# Patient Record
Sex: Female | Born: 1985 | Race: White | Hispanic: No | Marital: Single | Smoking: Never smoker
Health system: Southern US, Community
[De-identification: ages and names within clinical notes are randomized; demographics above are authoritative.]

---

## 2014-04-11 HISTORY — PX: CHOLECYSTECTOMY: SHX55

## 2015-12-01 ENCOUNTER — Ambulatory Visit (INDEPENDENT_AMBULATORY_CARE_PROVIDER_SITE_OTHER): Payer: BC Managed Care – PPO | Admitting: Internal Medicine

## 2015-12-01 ENCOUNTER — Encounter: Payer: Self-pay | Admitting: Internal Medicine

## 2015-12-01 VITALS — BP 124/86 | HR 87 | Temp 98.4°F | Resp 16 | Ht 66.0 in | Wt 175.0 lb

## 2015-12-01 DIAGNOSIS — Z23 Encounter for immunization: Secondary | ICD-10-CM

## 2015-12-01 DIAGNOSIS — L309 Dermatitis, unspecified: Secondary | ICD-10-CM | POA: Insufficient documentation

## 2015-12-01 DIAGNOSIS — Z0000 Encounter for general adult medical examination without abnormal findings: Secondary | ICD-10-CM

## 2015-12-01 NOTE — Progress Notes (Signed)
Subjective:    Patient ID: Theresa Bray, female    DOB: 1986-02-24, 30 y.o.   MRN: [id number]  HPI She is here to establish with a new pcp.  She is here for a physical exam.   She moved to the area about one year ago.  She has no concerns - just here to establish and have a physical exam.     Medications and allergies reviewed with patient and updated if appropriate.  Patient Active Problem List   Diagnosis Date Noted  . Eczema 12/01/2015    No current outpatient prescriptions on file prior to visit.   No current facility-administered medications on file prior to visit.     History reviewed. No pertinent past medical history.  Past Surgical History:  Procedure Laterality Date  . CHOLECYSTECTOMY  2016    Social History   Social History  . Marital status: Single    Spouse name: N/A  . Number of children: N/A  . Years of education: N/A   Social History Main Topics  . Smoking status: Never Smoker  . Smokeless tobacco: Never Used  . Alcohol use Yes     Comment: social  . Drug use: No  . Sexual activity: Not Asked   Other Topics Concern  . None   Social History Narrative   Exercise: regular    Family History  Problem Relation Age of Onset  . Hyperlipidemia Father   . Cancer Brother     Brain  . Cancer Maternal Grandfather     Prostate  . Cancer Paternal Grandfather     Lung    Review of Systems  Constitutional: Negative for appetite change, fatigue and fever.  Eyes: Negative for visual disturbance.  Respiratory: Negative for cough, shortness of breath and wheezing.   Cardiovascular: Negative for chest pain, palpitations and leg swelling.  Gastrointestinal: Negative for abdominal pain, blood in stool, constipation, diarrhea and nausea.  Genitourinary: Negative for dysuria and hematuria.  Musculoskeletal: Negative for arthralgias and back pain.  Skin: Negative for color change and rash.  Neurological: Negative for dizziness, light-headedness  and headaches.  Psychiatric/Behavioral: Negative for dysphoric mood. The patient is not nervous/anxious.        Objective:   Vitals:   12/01/15 0912  BP: 124/86  Pulse: 87  Resp: 16  Temp: 98.4 F (36.9 C)   Filed Weights   12/01/15 0912  Weight: 175 lb (79.4 kg)   Body mass index is 28.25 kg/m.   Physical Exam Constitutional: She appears well-developed and well-nourished. No distress.  HENT:  Head: Normocephalic and atraumatic.  Right Ear: External ear normal. Normal ear canal and TM Left Ear: External ear normal.  Normal ear canal and TM Mouth/Throat: Oropharynx is clear and moist.  Eyes: Conjunctivae and EOM are normal.  Neck: Neck supple. No tracheal deviation present. No thyromegaly present.  No carotid bruit  Cardiovascular: Normal rate, regular rhythm and normal heart sounds.   No murmur heard.  No edema. Pulmonary/Chest: Effort normal and breath sounds normal. No respiratory distress. She has no wheezes. She has no rales.  Breast: deferred to Gyn Abdominal: Soft. She exhibits no distension. There is no tenderness.  Lymphadenopathy: She has no cervical adenopathy.  Skin: Skin is warm and dry. She is not diaphoretic.  Psychiatric: She has a normal mood and affect. Her behavior is normal.         Assessment & Plan:   Physical exam: Screening blood work ordered Immunizations  tdap today  Gyn  - needs to establish - recommendations given Eye exams  Up to date  Exercise  - regular Weight - slightly overweight - started regular exercise again Skin   -no coners Substance abuse  -  none  See Problem List for Assessment and Plan of chronic medical problems.

## 2015-12-01 NOTE — Patient Instructions (Addendum)
[facility] - Dr Z[facility] [address] [place], [place] Across the street from [place], [place]   Test(s) ordered today. Your results will be released to [place] (or called to you) after review, usually within 72hours after test completion. If any changes need to be made, you will be notified at that same time.  All other Health Maintenance issues reviewed.   All recommended immunizations and age-appropriate screenings are up-to-date or discussed.  Tetanus vaccine administered today.   Medications reviewed and updated.  No changes recommended at this time.      Health Maintenance, Female Adopting a healthy lifestyle and getting preventive care can go a long way to promote health and wellness. Talk with your health care provider about what schedule of regular examinations is right for you. This is a good chance for you to check in with your provider about disease prevention and staying healthy. In between checkups, there are plenty of things you can do on your own. Experts have done a lot of research about which lifestyle changes and preventive measures are most likely to keep you healthy. Ask your health care provider for more information. WEIGHT AND DIET  Eat a healthy diet  Be sure to include plenty of vegetables, fruits, low-fat dairy products, and lean protein.  Do not eat a lot of foods high in solid fats, added sugars, or salt.  Get regular exercise. This is one of the most important things you can do for your health.  Most adults should exercise for at least 150 minutes each week. The exercise should increase your heart rate and make you sweat (moderate-intensity exercise).  Most adults should also do strengthening exercises at least twice a week. This is in addition to the  moderate-intensity exercise.  Maintain a healthy weight  Body mass index (BMI) is a measurement that can be used to identify possible weight problems. It estimates body fat based on height and weight. Your health care provider can help determine your BMI and help you achieve or maintain a healthy weight.  For females 110 years of age and older:   A BMI below 18.5 is considered underweight.  A BMI of 18.5 to 24.9 is normal.  A BMI of 25 to 29.9 is considered overweight.  A BMI of 30 and above is considered obese.  Watch levels of cholesterol and blood lipids  You should start having your blood tested for lipids and cholesterol at 30 years of age, then have this test every 5 years.  You may need to have your cholesterol levels checked more often if:  Your lipid or cholesterol levels are high.  You are older than 30 years of age.  You are at high risk for heart disease.  CANCER SCREENING   Lung Cancer  Lung cancer screening is recommended for adults 64-63 years old who are at high risk for lung cancer because of a history of smoking.  A yearly low-dose CT scan of the lungs is recommended for people who:  Currently smoke.  Have quit within the past 15 years.  Have at least a 30-pack-year history of smoking. A pack year is smoking an average of one pack of cigarettes a day for 1 year.  Yearly screening should continue until it has been 15 years since you quit.  Yearly screening should stop if you  develop a health problem that would prevent you from having lung cancer treatment.  Breast Cancer  Practice breast self-awareness. This means understanding how your breasts normally appear and feel.  It also means doing regular breast self-exams. Let your health care provider know about any changes, no matter how small.  If you are in your 20s or 30s, you should have a clinical breast exam (CBE) by a health care provider every 1-3 years as part of a regular health exam.  If  you are 67 or older, have a CBE every year. Also consider having a breast X-ray (mammogram) every year.  If you have a family history of breast cancer, talk to your health care provider about genetic screening.  If you are at high risk for breast cancer, talk to your health care provider about having an MRI and a mammogram every year.  Breast cancer gene (BRCA) assessment is recommended for women who have family members with BRCA-related cancers. BRCA-related cancers include:  Breast.  Ovarian.  Tubal.  Peritoneal cancers.  Results of the assessment will determine the need for genetic counseling and BRCA1 and BRCA2 testing. Cervical Cancer Your health care provider may recommend that you be screened regularly for cancer of the pelvic organs (ovaries, uterus, and vagina). This screening involves a pelvic examination, including checking for microscopic changes to the surface of your cervix (Pap test). You may be encouraged to have this screening done every 3 years, beginning at age 57.  For women ages 59-65, health care providers may recommend pelvic exams and Pap testing every 3 years, or they may recommend the Pap and pelvic exam, combined with testing for human papilloma virus (HPV), every 5 years. Some types of HPV increase your risk of cervical cancer. Testing for HPV may also be done on women of any age with unclear Pap test results.  Other health care providers may not recommend any screening for nonpregnant women who are considered low risk for pelvic cancer and who do not have symptoms. Ask your health care provider if a screening pelvic exam is right for you.  If you have had past treatment for cervical cancer or a condition that could lead to cancer, you need Pap tests and screening for cancer for at least 20 years after your treatment. If Pap tests have been discontinued, your risk factors (such as having a new sexual partner) need to be reassessed to determine if screening should  resume. Some women have medical problems that increase the chance of getting cervical cancer. In these cases, your health care provider may recommend more frequent screening and Pap tests. Colorectal Cancer  This type of cancer can be detected and often prevented.  Routine colorectal cancer screening usually begins at 30 years of age and continues through 30 years of age.  Your health care provider may recommend screening at an earlier age if you have risk factors for colon cancer.  Your health care provider may also recommend using home test kits to check for hidden blood in the stool.  A small camera at the end of a tube can be used to examine your colon directly (sigmoidoscopy or colonoscopy). This is done to check for the earliest forms of colorectal cancer.  Routine screening usually begins at age 62.  Direct examination of the colon should be repeated every 5-10 years through 29 years of age. However, you may need to be screened more often if early forms of precancerous polyps or small growths are found. Skin Cancer  Check your skin from head to toe regularly.  Tell your health care provider about any new moles or changes in moles, especially if there is a change in a mole's shape or color.  Also tell your health care provider if you have a mole that is larger than the size of a pencil eraser.  Always use sunscreen. Apply sunscreen liberally and repeatedly throughout the day.  Protect yourself by wearing long sleeves, pants, a wide-brimmed hat, and sunglasses whenever you are outside. HEART DISEASE, DIABETES, AND HIGH BLOOD PRESSURE   High blood pressure causes heart disease and increases the risk of stroke. High blood pressure is more likely to develop in:  People who have blood pressure in the high end of the normal range (130-139/85-89 mm Hg).  People who are overweight or obese.  People who are African American.  If you are 20-2 years of age, have your blood pressure  checked every 3-5 years. If you are 22 years of age or older, have your blood pressure checked every year. You should have your blood pressure measured twice--once when you are at a hospital or clinic, and once when you are not at a hospital or clinic. Record the average of the two measurements. To check your blood pressure when you are not at a hospital or clinic, you can use:  An automated blood pressure machine at a pharmacy.  A home blood pressure monitor.  If you are between 12 years and 65 years old, ask your health care provider if you should take aspirin to prevent strokes.  Have regular diabetes screenings. This involves taking a blood sample to check your fasting blood sugar level.  If you are at a normal weight and have a low risk for diabetes, have this test once every three years after 30 years of age.  If you are overweight and have a high risk for diabetes, consider being tested at a younger age or more often. PREVENTING INFECTION  Hepatitis B  If you have a higher risk for hepatitis B, you should be screened for this virus. You are considered at high risk for hepatitis B if:  You were born in a country where hepatitis B is common. Ask your health care provider which countries are considered high risk.  Your parents were born in a high-risk country, and you have not been immunized against hepatitis B (hepatitis B vaccine).  You have HIV or AIDS.  You use needles to inject street drugs.  You live with someone who has hepatitis B.  You have had sex with someone who has hepatitis B.  You get hemodialysis treatment.  You take certain medicines for conditions, including cancer, organ transplantation, and autoimmune conditions. Hepatitis C  Blood testing is recommended for:  Everyone born from [birth year] through 1965.  Anyone with known risk factors for hepatitis C. Sexually transmitted infections (STIs)  You should be screened for sexually transmitted infections (STIs)  including gonorrhea and chlamydia if:  You are sexually active and are younger than 30 years of age.  You are older than 30 years of age and your health care provider tells you that you are at risk for this type of infection.  Your sexual activity has changed since you were last screened and you are at an increased risk for chlamydia or gonorrhea. Ask your health care provider if you are at risk.  If you do not have HIV, but are at risk, it may be recommended that you take a prescription medicine  daily to prevent HIV infection. This is called pre-exposure prophylaxis (PrEP). You are considered at risk if:  You are sexually active and do not regularly use condoms or know the HIV status of your partner(s).  You take drugs by injection.  You are sexually active with a partner who has HIV. Talk with your health care provider about whether you are at high risk of being infected with HIV. If you choose to begin PrEP, you should first be tested for HIV. You should then be tested every 3 months for as long as you are taking PrEP.  PREGNANCY   If you are premenopausal and you may become pregnant, ask your health care provider about preconception counseling.  If you may become pregnant, take 400 to 800 micrograms (mcg) of folic acid every day.  If you want to prevent pregnancy, talk to your health care provider about birth control (contraception). OSTEOPOROSIS AND MENOPAUSE   Osteoporosis is a disease in which the bones lose minerals and strength with aging. This can result in serious bone fractures. Your risk for osteoporosis can be identified using a bone density scan.  If you are 65 years of age or older, or if you are at risk for osteoporosis and fractures, ask your health care provider if you should be screened.  Ask your health care provider whether you should take a calcium or vitamin D supplement to lower your risk for osteoporosis.  Menopause may have certain physical symptoms and  risks.  Hormone replacement therapy may reduce some of these symptoms and risks. Talk to your health care provider about whether hormone replacement therapy is right for you.  HOME CARE INSTRUCTIONS   Schedule regular health, dental, and eye exams.  Stay current with your immunizations.   Do not use any tobacco products including cigarettes, chewing tobacco, or electronic cigarettes.  If you are pregnant, do not drink alcohol.  If you are breastfeeding, limit how much and how often you drink alcohol.  Limit alcohol intake to no more than 1 drink per day for nonpregnant women. One drink equals 12 ounces of beer, 5 ounces of wine, or 1 ounces of hard liquor.  Do not use street drugs.  Do not share needles.  Ask your health care provider for help if you need support or information about quitting drugs.  Tell your health care provider if you often feel depressed.  Tell your health care provider if you have ever been abused or do not feel safe at home.   This information is not intended to replace advice given to you by your health care provider. Make sure you discuss any questions you have with your health care provider.   Document Released: 10/11/2010 Document Revised: 04/18/2014 Document Reviewed: 02/27/2013 Elsevier Interactive Patient Education [employer].

## 2015-12-01 NOTE — Assessment & Plan Note (Signed)
Controlled, no treatment needed

## 2015-12-01 NOTE — Progress Notes (Signed)
Pre visit review using our clinic review tool, if applicable. No additional management support is needed unless otherwise documented below in the visit note. 

## 2015-12-04 ENCOUNTER — Other Ambulatory Visit (INDEPENDENT_AMBULATORY_CARE_PROVIDER_SITE_OTHER): Payer: BC Managed Care – PPO

## 2015-12-04 DIAGNOSIS — Z0000 Encounter for general adult medical examination without abnormal findings: Secondary | ICD-10-CM | POA: Diagnosis not present

## 2015-12-04 LAB — COMPREHENSIVE METABOLIC PANEL
ALBUMIN: 3.6 g/dL (ref 3.5–5.2)
ALT: 12 U/L (ref 0–35)
AST: 17 U/L (ref 0–37)
Alkaline Phosphatase: 24 U/L — ABNORMAL LOW (ref 39–117)
BILIRUBIN TOTAL: 0.3 mg/dL (ref 0.2–1.2)
BUN: 15 mg/dL (ref 6–23)
CALCIUM: 8.3 mg/dL — AB (ref 8.4–10.5)
CHLORIDE: 109 meq/L (ref 96–112)
CO2: 25 mEq/L (ref 19–32)
CREATININE: 0.69 mg/dL (ref 0.40–1.20)
GFR: 106.06 mL/min (ref >60.00)
Glucose, Bld: 87 mg/dL (ref 70–99)
POTASSIUM: 4.2 meq/L (ref 3.5–5.1)
SODIUM: 140 meq/L (ref 135–145)
TOTAL PROTEIN: 6.5 g/dL (ref 6.0–8.3)

## 2015-12-04 LAB — LIPID PANEL
CHOLESTEROL: 168 mg/dL (ref 0–200)
HDL: 50.6 mg/dL (ref >39.00)
LDL CALC: 104 mg/dL — AB (ref 0–99)
NonHDL: 116.99
TRIGLYCERIDES: 67 mg/dL (ref 0.0–149.0)
Total CHOL/HDL Ratio: 3
VLDL: 13.4 mg/dL (ref 0.0–40.0)

## 2015-12-04 LAB — CBC WITH DIFFERENTIAL/PLATELET
BASOS PCT: 0.3 % (ref 0.0–3.0)
Basophils Absolute: 0 10*3/uL (ref 0.0–0.1)
EOS PCT: 2.1 % (ref 0.0–5.0)
Eosinophils Absolute: 0.1 10*3/uL (ref 0.0–0.7)
HEMATOCRIT: 38.4 % (ref 36.0–46.0)
HEMOGLOBIN: 13.1 g/dL (ref 12.0–15.0)
LYMPHS PCT: 29 % (ref 12.0–46.0)
Lymphs Abs: 1.2 10*3/uL (ref 0.7–4.0)
MCHC: 34.2 g/dL (ref 30.0–36.0)
MCV: 84.2 fl (ref 78.0–100.0)
MONOS PCT: 12.1 % — AB (ref 3.0–12.0)
Monocytes Absolute: 0.5 10*3/uL (ref 0.1–1.0)
Neutro Abs: 2.3 10*3/uL (ref 1.4–7.7)
Neutrophils Relative %: 56.5 % (ref 43.0–77.0)
Platelets: 199 10*3/uL (ref 150.0–400.0)
RBC: 4.57 Mil/uL (ref 3.87–5.11)
RDW: 14.1 % (ref 11.5–15.5)
WBC: 4.1 10*3/uL (ref 4.0–10.5)

## 2015-12-04 LAB — TSH: TSH: 1.44 u[IU]/mL (ref 0.35–4.50)

## 2018-09-10 ENCOUNTER — Telehealth: Payer: BC Managed Care – PPO | Admitting: Physician Assistant

## 2018-09-10 ENCOUNTER — Encounter: Payer: Self-pay | Admitting: Physician Assistant

## 2018-09-10 DIAGNOSIS — M545 Low back pain, unspecified: Secondary | ICD-10-CM

## 2018-09-10 MED ORDER — NAPROXEN 500 MG PO TABS: 500.0000 mg | ORAL_TABLET | Freq: Two times a day (BID) | ORAL | 0 refills | Status: DC

## 2018-09-10 MED ORDER — CYCLOBENZAPRINE HCL 10 MG PO TABS: 10.0000 mg | ORAL_TABLET | Freq: Three times a day (TID) | ORAL | 0 refills | Status: DC | PRN

## 2018-09-10 NOTE — Progress Notes (Signed)

## 2018-09-18 ENCOUNTER — Other Ambulatory Visit: Payer: Self-pay | Admitting: Physician Assistant

## 2018-10-26 ENCOUNTER — Encounter: Payer: Self-pay | Admitting: Internal Medicine

## 2018-11-05 NOTE — Progress Notes (Signed)
Subjective:    Patient ID: Theresa Bray, female    DOB: [birth year], 33 y.o.   MRN: [id number]  HPI She is here for a physical exam.   She is currently working from home.  Overall she is doing well.  She does have chronic lower back pain.  She has had this for years and initially was intermittent, but it has been more consistent recently.  She wakes up with the pain and notices it with certain positions or activities.  She recently did need visit and was prescribed Naprosyn and Flexeril.  The Naprosyn seemed to help, but the muscle relaxer did not.  She has never had her back evaluated.  She is exercising-walks.  She denies any back pain with walking.  Medications and allergies reviewed with patient and updated if appropriate.  Patient Active Problem List   Diagnosis Date Noted  . Family history of thyroid disease in mother 11/06/2018  . Lower back pain 11/06/2018  . Eczema 12/01/2015    No current outpatient medications on file prior to visit.   No current facility-administered medications on file prior to visit.     History reviewed. No pertinent past medical history.  Past Surgical History:  Procedure Laterality Date  . CHOLECYSTECTOMY  2016    Social History   Socioeconomic History  . Marital status: Single    Spouse name: Not on file  . Number of children: Not on file  . Years of education: Not on file  . Highest education level: Not on file  Occupational History  . Not on file  Social Needs  . Financial resource strain: Not on file  . Food insecurity    Worry: Not on file    Inability: Not on file  . Transportation needs    Medical: Not on file    Non-medical: Not on file  Tobacco Use  . Smoking status: Never Smoker  . Smokeless tobacco: Never Used  Substance and Sexual Activity  . Alcohol use: Yes    Comment: social  . Drug use: No  . Sexual activity: Not on file  Lifestyle  . Physical activity    Days per week: Not on file    Minutes per  session: Not on file  . Stress: Not on file  Relationships  . Social Herbalist on phone: Not on file    Gets together: Not on file    Attends religious service: Not on file    Active member of club or organization: Not on file    Attends meetings of clubs or organizations: Not on file    Relationship status: Not on file  Other Topics Concern  . Not on file  Social History Narrative   Exercise: regular    Family History  Problem Relation Age of Onset  . Hyperlipidemia Father   . Cancer Brother        Brain  . Cancer Maternal Grandfather        Prostate  . Cancer Paternal Grandfather        Lung  . Graves' disease Mother   . Cancer Maternal Aunt        bile duct cancer  . Cancer Maternal Uncle        lymphoma    Review of Systems  Constitutional: Negative for chills and fever.  Eyes: Negative for visual disturbance.  Respiratory: Negative for cough, shortness of breath and wheezing.   Cardiovascular: Negative for chest pain, palpitations and leg  swelling.  Gastrointestinal: Negative for abdominal pain, blood in stool, constipation, diarrhea and nausea.       No gerd  Genitourinary: Negative for dysuria and hematuria.  Musculoskeletal: Positive for back pain.  Skin: Negative for color change and rash.  Neurological: Negative for light-headedness and headaches.  Psychiatric/Behavioral: Negative for dysphoric mood. The patient is not nervous/anxious.        Objective:   Vitals:   11/06/18 1455  BP: 94/62  Pulse: 87  Resp: 14  Temp: 98.3 F (36.8 C)  SpO2: 97%   Filed Weights   11/06/18 1455  Weight: 217 lb (98.4 kg)   Body mass index is 35.02 kg/m.  BP Readings from Last 3 Encounters:  11/06/18 94/62  12/01/15 124/86    Wt Readings from Last 3 Encounters:  11/06/18 217 lb (98.4 kg)  12/01/15 175 lb (79.4 kg)     Physical Exam Constitutional: She appears well-developed and well-nourished. No distress.  HENT:  Head: Normocephalic and  atraumatic.  Right Ear: External ear normal. Normal ear canal and TM Left Ear: External ear normal.  Normal ear canal and TM Mouth/Throat: Oropharynx is clear and moist.  Eyes: Conjunctivae and EOM are normal.  Neck: Neck supple. No tracheal deviation present. No thyromegaly present.  No carotid bruit  Cardiovascular: Normal rate, regular rhythm and normal heart sounds.   No murmur heard.  No edema. Pulmonary/Chest: Effort normal and breath sounds normal. No respiratory distress. She has no wheezes. She has no rales.  Breast: deferred   Abdominal: Soft. She exhibits no distension. There is no tenderness.  Lymphadenopathy: She has no cervical adenopathy.  Skin: Skin is warm and dry. She is not diaphoretic.  Psychiatric: She has a normal mood and affect. Her behavior is normal.        Assessment & Plan:   Physical exam: Screening blood work ordered Immunizations   Up to date  Gyn  Up to date  Exercise    Walking dog daily - walks 3-4 miles most days Weight   Advised weight loss-advised decreasing portions, revising diet Skin no concerns Substance abuse  none  See Problem List for Assessment and Plan of chronic medical problems.

## 2018-11-05 NOTE — Patient Instructions (Addendum)
Tests ordered today. Your results will be released to Laird (or called to you) after review.  If any changes need to be made, you will be notified at that same time.  All other Health Maintenance issues reviewed.   All recommended immunizations and age-appropriate screenings are up-to-date or discussed.  No immunization administered today.   Medications reviewed and updated.  Changes include :   none      Health Maintenance, Female Adopting a healthy lifestyle and getting preventive care are important in promoting health and wellness. Ask your health care provider about:  The right schedule for you to have regular tests and exams.  Things you can do on your own to prevent diseases and keep yourself healthy. What should I know about diet, weight, and exercise? Eat a healthy diet   Eat a diet that includes plenty of vegetables, fruits, low-fat dairy products, and lean protein.  Do not eat a lot of foods that are high in solid fats, added sugars, or sodium. Maintain a healthy weight Body mass index (BMI) is used to identify weight problems. It estimates body fat based on height and weight. Your health care provider can help determine your BMI and help you achieve or maintain a healthy weight. Get regular exercise Get regular exercise. This is one of the most important things you can do for your health. Most adults should:  Exercise for at least 150 minutes each week. The exercise should increase your heart rate and make you sweat (moderate-intensity exercise).  Do strengthening exercises at least twice a week. This is in addition to the moderate-intensity exercise.  Spend less time sitting. Even light physical activity can be beneficial. Watch cholesterol and blood lipids Have your blood tested for lipids and cholesterol at 33 years of age, then have this test every 5 years. Have your cholesterol levels checked more often if:  Your lipid or cholesterol levels are high.  You  are older than 33 years of age.  You are at high risk for heart disease. What should I know about cancer screening? Depending on your health history and family history, you may need to have cancer screening at various ages. This may include screening for:  Breast cancer.  Cervical cancer.  Colorectal cancer.  Skin cancer.  Lung cancer. What should I know about heart disease, diabetes, and high blood pressure? Blood pressure and heart disease  High blood pressure causes heart disease and increases the risk of stroke. This is more likely to develop in people who have high blood pressure readings, are of African descent, or are overweight.  Have your blood pressure checked: ? Every 3-5 years if you are 52-46 years of age. ? Every year if you are 74 years old or older. Diabetes Have regular diabetes screenings. This checks your fasting blood sugar level. Have the screening done:  Once every three years after age 82 if you are at a normal weight and have a low risk for diabetes.  More often and at a younger age if you are overweight or have a high risk for diabetes. What should I know about preventing infection? Hepatitis B If you have a higher risk for hepatitis B, you should be screened for this virus. Talk with your health care provider to find out if you are at risk for hepatitis B infection. Hepatitis C Testing is recommended for:  Everyone born from [birth year] through 1965.  Anyone with known risk factors for hepatitis C. Sexually transmitted infections (STIs)  Get  screened for STIs, including gonorrhea and chlamydia, if: ? You are sexually active and are younger than 33 years of age. ? You are older than 33 years of age and your health care provider tells you that you are at risk for this type of infection. ? Your sexual activity has changed since you were last screened, and you are at increased risk for chlamydia or gonorrhea. Ask your health care provider if you are at risk.   Ask your health care provider about whether you are at high risk for HIV. Your health care provider may recommend a prescription medicine to help prevent HIV infection. If you choose to take medicine to prevent HIV, you should first get tested for HIV. You should then be tested every 3 months for as long as you are taking the medicine. Pregnancy  If you are about to stop having your period (premenopausal) and you may become pregnant, seek counseling before you get pregnant.  Take 400 to 800 micrograms (mcg) of folic acid every day if you become pregnant.  Ask for birth control (contraception) if you want to prevent pregnancy. Osteoporosis and menopause Osteoporosis is a disease in which the bones lose minerals and strength with aging. This can result in bone fractures. If you are 33 years old or older, or if you are at risk for osteoporosis and fractures, ask your health care provider if you should:  Be screened for bone loss.  Take a calcium or vitamin D supplement to lower your risk of fractures.  Be given hormone replacement therapy (HRT) to treat symptoms of menopause. Follow these instructions at home: Lifestyle  Do not use any products that contain nicotine or tobacco, such as cigarettes, e-cigarettes, and chewing tobacco. If you need help quitting, ask your health care provider.  Do not use street drugs.  Do not share needles.  Ask your health care provider for help if you need support or information about quitting drugs. Alcohol use  Do not drink alcohol if: ? Your health care provider tells you not to drink. ? You are pregnant, may be pregnant, or are planning to become pregnant.  If you drink alcohol: ? Limit how much you use to 0-1 drink a day. ? Limit intake if you are breastfeeding.  Be aware of how much alcohol is in your drink. In the [place] one drink equals one 12 oz bottle of beer (355 mL), one 5 oz glass of wine (148 mL), or one 1 oz glass of hard liquor (44  mL). General instructions  Schedule regular health, dental, and eye exams.  Stay current with your vaccines.  Tell your health care provider if: ? You often feel depressed. ? You have ever been abused or do not feel safe at home. Summary  Adopting a healthy lifestyle and getting preventive care are important in promoting health and wellness.  Follow your health care provider's instructions about healthy diet, exercising, and getting tested or screened for diseases.  Follow your health care provider's instructions on monitoring your cholesterol and blood pressure. This information is not intended to replace advice given to you by your health care provider. Make sure you discuss any questions you have with your health care provider. Document Released: 10/11/2010 Document Revised: 03/21/2018 Document Reviewed: 03/21/2018 Elsevier Patient Education  2020 [employer]

## 2018-11-06 ENCOUNTER — Other Ambulatory Visit (INDEPENDENT_AMBULATORY_CARE_PROVIDER_SITE_OTHER): Payer: BC Managed Care – PPO

## 2018-11-06 ENCOUNTER — Other Ambulatory Visit: Payer: Self-pay

## 2018-11-06 ENCOUNTER — Encounter: Payer: Self-pay | Admitting: Internal Medicine

## 2018-11-06 ENCOUNTER — Ambulatory Visit (INDEPENDENT_AMBULATORY_CARE_PROVIDER_SITE_OTHER): Payer: BC Managed Care – PPO | Admitting: Internal Medicine

## 2018-11-06 VITALS — BP 94/62 | HR 87 | Temp 98.3°F | Resp 14 | Ht 66.0 in | Wt 217.0 lb

## 2018-11-06 DIAGNOSIS — G8929 Other chronic pain: Secondary | ICD-10-CM | POA: Diagnosis not present

## 2018-11-06 DIAGNOSIS — M545 Low back pain, unspecified: Secondary | ICD-10-CM | POA: Insufficient documentation

## 2018-11-06 DIAGNOSIS — Z8349 Family history of other endocrine, nutritional and metabolic diseases: Secondary | ICD-10-CM

## 2018-11-06 DIAGNOSIS — Z0000 Encounter for general adult medical examination without abnormal findings: Secondary | ICD-10-CM

## 2018-11-06 LAB — CBC WITH DIFFERENTIAL/PLATELET
Basophils Absolute: 0 10*3/uL (ref 0.0–0.1)
Basophils Relative: 0.5 % (ref 0.0–3.0)
Eosinophils Absolute: 0.1 10*3/uL (ref 0.0–0.7)
Eosinophils Relative: 1.4 % (ref 0.0–5.0)
HCT: 43 % (ref 36.0–46.0)
Hemoglobin: 14.5 g/dL (ref 12.0–15.0)
Lymphocytes Relative: 31.6 % (ref 12.0–46.0)
Lymphs Abs: 2.1 10*3/uL (ref 0.7–4.0)
MCHC: 33.6 g/dL (ref 30.0–36.0)
MCV: 85.2 fl (ref 78.0–100.0)
Monocytes Absolute: 0.6 10*3/uL (ref 0.1–1.0)
Monocytes Relative: 8.8 % (ref 3.0–12.0)
Neutro Abs: 3.9 10*3/uL (ref 1.4–7.7)
Neutrophils Relative %: 57.7 % (ref 43.0–77.0)
Platelets: 275 10*3/uL (ref 150.0–400.0)
RBC: 5.05 Mil/uL (ref 3.87–5.11)
RDW: 12.6 % (ref 11.5–15.5)
WBC: 6.7 10*3/uL (ref 4.0–10.5)

## 2018-11-06 LAB — COMPREHENSIVE METABOLIC PANEL
ALT: 22 U/L (ref 0–35)
AST: 20 U/L (ref 0–37)
Albumin: 4.3 g/dL (ref 3.5–5.2)
Alkaline Phosphatase: 48 U/L (ref 39–117)
BUN: 10 mg/dL (ref 6–23)
CO2: 25 mEq/L (ref 19–32)
Calcium: 9.4 mg/dL (ref 8.4–10.5)
Chloride: 105 mEq/L (ref 96–112)
Creatinine, Ser: 0.61 mg/dL (ref 0.40–1.20)
GFR: 112.89 mL/min (ref >60.00)
Glucose, Bld: 86 mg/dL (ref 70–99)
Potassium: 3.9 mEq/L (ref 3.5–5.1)
Sodium: 138 mEq/L (ref 135–145)
Total Bilirubin: 0.3 mg/dL (ref 0.2–1.2)
Total Protein: 7.4 g/dL (ref 6.0–8.3)

## 2018-11-06 LAB — LIPID PANEL
Cholesterol: 182 mg/dL (ref 0–200)
HDL: 43.7 mg/dL (ref >39.00)
LDL Cholesterol: 113 mg/dL — ABNORMAL HIGH (ref 0–99)
NonHDL: 138.39
Total CHOL/HDL Ratio: 4
Triglycerides: 127 mg/dL (ref 0.0–149.0)
VLDL: 25.4 mg/dL (ref 0.0–40.0)

## 2018-11-06 LAB — TSH: TSH: 2.16 u[IU]/mL (ref 0.35–4.50)

## 2018-11-06 NOTE — Assessment & Plan Note (Signed)
Mom recently diagnosed with Graves' disease Check TSH

## 2018-11-06 NOTE — Assessment & Plan Note (Addendum)
Has had lower back pain for years-initially intermittent, but now having daily back pain Naprosyn helps-okay to take as needed for now, but not continuously Continue regular walking Advised weight loss Refer to sports medication for further evaluation

## 2018-11-07 ENCOUNTER — Encounter: Payer: Self-pay | Admitting: Internal Medicine

## 2018-12-07 ENCOUNTER — Encounter: Payer: Self-pay | Admitting: Family Medicine

## 2018-12-07 ENCOUNTER — Other Ambulatory Visit: Payer: Self-pay

## 2018-12-07 ENCOUNTER — Ambulatory Visit (INDEPENDENT_AMBULATORY_CARE_PROVIDER_SITE_OTHER): Payer: BC Managed Care – PPO | Admitting: Family Medicine

## 2018-12-07 VITALS — BP 100/60 | HR 85 | Ht 66.0 in | Wt 216.0 lb

## 2018-12-07 DIAGNOSIS — M545 Low back pain, unspecified: Secondary | ICD-10-CM

## 2018-12-07 DIAGNOSIS — M999 Biomechanical lesion, unspecified: Secondary | ICD-10-CM | POA: Diagnosis not present

## 2018-12-07 NOTE — Patient Instructions (Signed)
Good to see you.  Ice 20 minutes 2 times daily. Usually after activity and before bed. Exercises 3 times a week.  Turmeric 500mg  daily  Vitamin D 2000 IU daily  See me again in 6-8 weeks

## 2018-12-07 NOTE — Assessment & Plan Note (Signed)
Low back pain seems to be more of a sacroiliac dysfunction.  Discussed icing regimen and home exercise, which activities of doing which wants to avoid.  Patient is to increase activity slowly over the course the next several weeks.  Patient given exercises from athletic trainer.  We discussed changing position when working or sitting long amount of time.  Follow-up again in 4 to 8 weeks

## 2018-12-07 NOTE — Assessment & Plan Note (Signed)
Decision today to treat with OMT was based on Physical Exam  After verbal consent patient was treated with HVLA, ME, FPR techniques in cervical, thoracic, rib,  lumbar and sacral areas  Patient tolerated the procedure well with improvement in symptoms  Patient given exercises, stretches and lifestyle modifications  See medications in patient instructions if given  Patient will follow up in 4-8 weeks 

## 2018-12-07 NOTE — Progress Notes (Signed)
Tawana ScaleZach Smith D.O. [facility] [address] [place], [place] [address] Phone: [phone] Subjective:   I Theresa NighKana Thompson am serving as a Neurosurgeonscribe for Dr. Antoine PrimasZachary Smith.  I'm seeing this patient by the request  of:  Pincus SanesBurns, Stacy J, MD   CC: Back pain  BJY:[id number]:Subjective  Theresa FlavinMeredith Bray is a 33 y.o. female coming in with complaint of back pain. No numbness and tingling noted. Pain radiates up the back sometimes.   Onset- Chronic  Location - lower back Character- sharp  Aggravating factors- sitting to standing  Reliving factors-  Therapies tried- naproxen, muscle relaxer  Severity-6 out of 10     No past medical history on file. Past Surgical History:  Procedure Laterality Date  . CHOLECYSTECTOMY  2016   Social History   Socioeconomic History  . Marital status: Single    Spouse name: Not on file  . Number of children: Not on file  . Years of education: Not on file  . Highest education level: Not on file  Occupational History  . Not on file  Social Needs  . Financial resource strain: Not on file  . Food insecurity    Worry: Not on file    Inability: Not on file  . Transportation needs    Medical: Not on file    Non-medical: Not on file  Tobacco Use  . Smoking status: Never Smoker  . Smokeless tobacco: Never Used  Substance and Sexual Activity  . Alcohol use: Yes    Comment: social  . Drug use: No  . Sexual activity: Not on file  Lifestyle  . Physical activity    Days per week: Not on file    Minutes per session: Not on file  . Stress: Not on file  Relationships  . Social Musicianconnections    Talks on phone: Not on file    Gets together: Not on file    Attends religious service: Not on file    Active member of club or organization: Not on file    Attends meetings of clubs or organizations: Not on file    Relationship status: Not on file  Other Topics Concern  . Not on file  Social History Narrative   Exercise: regular   Not on File Family History   Problem Relation Age of Onset  . Hyperlipidemia Father   . Cancer Brother        Brain  . Cancer Maternal Grandfather        Prostate  . Cancer Paternal Grandfather        Lung  . Graves' disease Mother   . Cancer Maternal Aunt        bile duct cancer  . Cancer Maternal Uncle        lymphoma   No current outpatient medications on file.    Past medical history, social, surgical and family history all reviewed in electronic medical record.  No pertanent information unless stated regarding to the chief complaint.   Review of Systems:  No headache, visual changes, nausea, vomiting, diarrhea, constipation, dizziness, abdominal pain, skin rash, fevers, chills, night sweats, weight loss, swollen lymph nodes, body aches, joint swelling, muscle aches, chest pain, shortness of breath, mood changes.   Objective  Blood pressure 100/60, pulse 85, height 5\' 6"  (1.676 m), weight 216 lb (98 kg), SpO2 98 %.    General: No apparent distress alert and oriented x3 mood and affect normal, dressed appropriately.  Patient is overweight HEENT: Pupils equal, extraocular  movements intact  Respiratory: Patient's speak in full sentences and does not appear short of breath  Cardiovascular: No lower extremity edema, non tender, no erythema  Skin: Warm dry intact with no signs of infection or rash on extremities or on axial skeleton.  Abdomen: Soft nontender  Neuro: Cranial nerves II through XII are intact, neurovascularly intact in all extremities with 2+ DTRs and 2+ pulses.  Lymph: No lymphadenopathy of posterior or anterior cervical chain or axillae bilaterally.  Gait normal with good balance and coordination.  MSK:  Non tender with full range of motion and good stability and symmetric strength and tone of shoulders, elbows, wrist, hip, knee and ankles bilaterally.  Back exam shows loss of lordosis of the lumbar spine.  Minimal tenderness over the sacroiliac joint bilaterally.  Tightness of the hamstrings  with sitting but patient has even more tightness with standing.  Negative Trendelenburg.  5-5 strength of the lower extremities.  Osteopathic findings C4 flexed rotated and side bent left C7 flexed rotated and side bent left T3 extended rotated and side bent right inhaled third rib T9 extended rotated and side bent left L2 flexed rotated and side bent right Sacrum right on right  [id number]; 15 additional minutes spent for Therapeutic exercises as stated in above notes.  This included exercises focusing on stretching, strengthening, with significant focus on eccentric aspects.   Long term goals include an improvement in range of motion, strength, endurance as well as avoiding reinjury. Patient's frequency would include in 1-2 times a Bray, 3-5 times a week for a duration of 6-12 weeks.  Low back exercises that included:  Pelvic tilt/bracing instruction to focus on control of the pelvic girdle and lower abdominal muscles  Glute strengthening exercises, focusing on proper firing of the glutes without engaging the low back muscles Proper stretching techniques for maximum relief for the hamstrings, hip flexors, low back and some rotation where tolerated Proper technique shown and discussed handout in great detail with ATC.  All questions were discussed and answered.     Impression and Recommendations:     This case required medical decision making of moderate complexity. The above documentation has been reviewed and is accurate and complete Lyndal Pulley, DO       Note: This dictation was prepared with Dragon dictation along with smaller phrase technology. Any transcriptional errors that result from this process are unintentional.

## 2018-12-11 ENCOUNTER — Other Ambulatory Visit: Payer: Self-pay

## 2018-12-11 ENCOUNTER — Ambulatory Visit (INDEPENDENT_AMBULATORY_CARE_PROVIDER_SITE_OTHER)
Admission: RE | Admit: 2018-12-11 | Discharge: 2018-12-11 | Disposition: A | Discharge status: Home / Self Care | Payer: BC Managed Care – PPO | Source: Ambulatory Visit | Attending: Family Medicine | Admitting: Family Medicine

## 2018-12-11 DIAGNOSIS — M545 Low back pain, unspecified: Secondary | ICD-10-CM

## 2019-01-29 ENCOUNTER — Other Ambulatory Visit: Payer: Self-pay

## 2019-01-29 DIAGNOSIS — Z20822 Contact with and (suspected) exposure to covid-19: Secondary | ICD-10-CM

## 2019-01-30 LAB — NOVEL CORONAVIRUS, NAA: SARS-CoV-2, NAA: NOT DETECTED

## 2019-01-31 ENCOUNTER — Other Ambulatory Visit: Payer: Self-pay

## 2019-01-31 ENCOUNTER — Ambulatory Visit: Payer: BC Managed Care – PPO | Admitting: Family Medicine

## 2019-01-31 ENCOUNTER — Encounter: Payer: Self-pay | Admitting: Family Medicine

## 2019-01-31 VITALS — BP 90/62 | HR 94 | Ht 66.0 in | Wt 215.8 lb

## 2019-01-31 DIAGNOSIS — M999 Biomechanical lesion, unspecified: Secondary | ICD-10-CM

## 2019-01-31 DIAGNOSIS — M545 Low back pain, unspecified: Secondary | ICD-10-CM

## 2019-01-31 DIAGNOSIS — G8929 Other chronic pain: Secondary | ICD-10-CM

## 2019-01-31 NOTE — Assessment & Plan Note (Signed)
Decision today to treat with OMT was based on Physical Exam  After verbal consent patient was treated with HVLA, ME, FPR techniques in cervical, thoracic, rib lumbar and sacral areas  Patient tolerated the procedure well with improvement in symptoms  Patient given exercises, stretches and lifestyle modifications  See medications in patient instructions if given  Patient will follow up in 4-8 weeks 

## 2019-01-31 NOTE — Progress Notes (Signed)
[facility] [address] [place], [place] [address] Phone: [phone] Subjective:    I'm seeing this patient by the request  of:    CC: Back pain follow-up  [id number]    I, Theresa Bray, LAT, ATC, am serving as scribe for Dr. Hulan Saas.  12/07/18: Low back pain seems to be more of a sacroiliac dysfunction.  Discussed icing regimen and home exercise, which activities of doing which wants to avoid.  Patient is to increase activity slowly over the course the next several weeks.  Patient given exercises from athletic trainer.  We discussed changing position when working or sitting long amount of time.  Follow-up again in 4 to 8 weeks  Update- 01/31/19 Theresa Bray is a 33 y.o. female coming in with complaint of back pain.  She states her low back pain is slightly better.  She states that she would like to go over her x-ray results.  She states that she's been doing her HEP on average 3x/week.      No past medical history on file. Past Surgical History:  Procedure Laterality Date  . CHOLECYSTECTOMY  2016   Social History   Socioeconomic History  . Marital status: Single    Spouse name: Not on file  . Number of children: Not on file  . Years of education: Not on file  . Highest education level: Not on file  Occupational History  . Not on file  Social Needs  . Financial resource strain: Not on file  . Food insecurity    Worry: Not on file    Inability: Not on file  . Transportation needs    Medical: Not on file    Non-medical: Not on file  Tobacco Use  . Smoking status: Never Smoker  . Smokeless tobacco: Never Used  Substance and Sexual Activity  . Alcohol use: Yes    Comment: social  . Drug use: No  . Sexual activity: Not on file  Lifestyle  . Physical activity    Days per week: Not on file    Minutes per session: Not on file  . Stress: Not on file  Relationships  . Social Herbalist on phone: Not on file   Gets together: Not on file    Attends religious service: Not on file    Active member of club or organization: Not on file    Attends meetings of clubs or organizations: Not on file    Relationship status: Not on file  Other Topics Concern  . Not on file  Social History Narrative   Exercise: regular   Not on File Family History  Problem Relation Age of Onset  . Hyperlipidemia Father   . Cancer Brother        Brain  . Cancer Maternal Grandfather        Prostate  . Cancer Paternal Grandfather        Lung  . Graves' disease Mother   . Cancer Maternal Aunt        bile duct cancer  . Cancer Maternal Uncle        lymphoma   No current outpatient medications on file.    Past medical history, social, surgical and family history all reviewed in electronic medical record.  No pertanent information unless stated regarding to the chief complaint.   Review of Systems:  No headache, visual changes, nausea, vomiting, diarrhea, constipation, dizziness, abdominal pain, skin rash, fevers, chills, night sweats,  weight loss, swollen lymph nodes, body aches, joint swelling, muscle aches, chest pain, shortness of breath, mood changes.   Objective  Blood pressure 90/62, pulse 94, height 5\' 6"  (1.676 m), weight 215 lb 12.8 oz (97.9 kg), SpO2 98 %.   General: No apparent distress alert and oriented x3 mood and affect normal, dressed appropriately.  HEENT: Pupils equal, extraocular movements intact  Respiratory: Patient's speak in full sentences and does not appear short of breath  Cardiovascular: No lower extremity edema, non tender, no erythema  Skin: Warm dry intact with no signs of infection or rash on extremities or on axial skeleton.  Abdomen: Soft nontender  Neuro: Cranial nerves II through XII are intact, neurovascularly intact in all extremities with 2+ DTRs and 2+ pulses.  Lymph: No lymphadenopathy of posterior or anterior cervical chain or axillae bilaterally.  Gait normal with good  balance and coordination.  MSK:  Non tender with full range of motion and good stability and symmetric strength and tone of shoulders, elbows, wrist, hip, knee and ankles bilaterally.  Back Exam:  Inspection: Unremarkable  Motion: Flexion 40 deg, Extension 25 deg, Side Bending to 35 deg bilaterally,  Rotation to 45 deg bilaterally  SLR laying: Negative  XSLR laying: Negative  Palpable tenderness: Tender to palpation in paraspinal musculature lumbar spine right greater than left. FABER: Mild tightness bilaterally. Sensory change: Gross sensation intact to all lumbar and sacral dermatomes.  Reflexes: 2+ at both patellar tendons, 2+ at achilles tendons, Babinski's downgoing.  Strength at foot  Plantar-flexion: 5/5 Dorsi-flexion: 5/5 Eversion: 5/5 Inversion: 5/5  Leg strength  Quad: 5/5 Hamstring: 5/5 Hip flexor: 5/5 Hip abductors: 5/5   Osteopathic findings  C6 flexed rotated and side bent left T3 extended rotated and side bent right inhaled third rib T6 extended rotated and side bent left L2 flexed rotated and side bent right Sacrum right on right    Impression and Recommendations:     This case required medical decision making of moderate complexity. The above documentation has been reviewed and is accurate and complete , DO       Note: This dictation was prepared with Dragon dictation along with smaller phrase technology. Any transcriptional errors that result from this process are unintentional.

## 2019-01-31 NOTE — Patient Instructions (Signed)
Keep it up.  See me again in 2-3 months

## 2019-01-31 NOTE — Assessment & Plan Note (Signed)
Chronic intermittent pain.  Discussed with patient about home exercise, what activities to doing which will still avoid.  Patient is to increase activity as tolerated.  Discussed avoiding certain activities.  Follow-up again in 4 to 8 weeks responding well to manipulation

## 2019-02-01 ENCOUNTER — Ambulatory Visit: Payer: BC Managed Care – PPO | Admitting: Family Medicine

## 2019-03-28 ENCOUNTER — Ambulatory Visit: Payer: BC Managed Care – PPO | Attending: Internal Medicine

## 2019-03-28 ENCOUNTER — Other Ambulatory Visit: Payer: Self-pay

## 2019-03-28 DIAGNOSIS — Z20822 Contact with and (suspected) exposure to covid-19: Secondary | ICD-10-CM

## 2019-03-29 ENCOUNTER — Encounter: Payer: Self-pay | Admitting: Internal Medicine

## 2019-03-29 LAB — NOVEL CORONAVIRUS, NAA: SARS-CoV-2, NAA: NOT DETECTED

## 2019-04-15 ENCOUNTER — Encounter: Payer: Self-pay | Admitting: Internal Medicine

## 2019-04-22 ENCOUNTER — Ambulatory Visit: Payer: BC Managed Care – PPO | Admitting: Family Medicine

## 2019-04-22 ENCOUNTER — Other Ambulatory Visit: Payer: Self-pay

## 2019-04-22 ENCOUNTER — Encounter: Payer: Self-pay | Admitting: Family Medicine

## 2019-04-22 VITALS — BP 118/70 | HR 82 | Ht 66.0 in | Wt 226.0 lb

## 2019-04-22 DIAGNOSIS — G8929 Other chronic pain: Secondary | ICD-10-CM

## 2019-04-22 DIAGNOSIS — M545 Low back pain: Secondary | ICD-10-CM

## 2019-04-22 DIAGNOSIS — M999 Biomechanical lesion, unspecified: Secondary | ICD-10-CM | POA: Diagnosis not present

## 2019-04-22 NOTE — Patient Instructions (Signed)
Good to see you Doing great Keep up the exercises and vitamins See me again in 3-4 months

## 2019-04-22 NOTE — Progress Notes (Signed)
[facility] [address] [place] Phone: [phone] Subjective:   I Theresa Bray am serving as a Education administrator for Dr. Hulan Saas.  This visit occurred during the SARS-CoV-2 public health emergency.  Safety protocols were in place, including screening questions prior to the visit, additional usage of staff PPE, and extensive cleaning of exam room while observing appropriate contact time as indicated for disinfecting solutions.     CC: Low back pain  OZD:[id number]   01/31/2019 Chronic intermittent pain.  Discussed with patient about home exercise, what activities to doing which will still avoid.  Patient is to increase activity as tolerated.  Discussed avoiding certain activities.  Follow-up again in 4 to 8 weeks responding well to manipulation  04/22/2019 Theresa Bray is a 34 y.o. female coming in with complaint of back pain. Patient states she is doing well. No worse.  Patient has been doing better overall.  Not having as much significant discomfort.  Patient does notice sitting for long amount of time though still seems to cause more discomfort and pain.      No past medical history on file. Past Surgical History:  Procedure Laterality Date  . CHOLECYSTECTOMY  2016   Social History   Socioeconomic History  . Marital status: Single    Spouse name: Not on file  . Number of children: Not on file  . Years of education: Not on file  . Highest education level: Not on file  Occupational History  . Not on file  Tobacco Use  . Smoking status: Never Smoker  . Smokeless tobacco: Never Used  Substance and Sexual Activity  . Alcohol use: Yes    Comment: social  . Drug use: No  . Sexual activity: Not on file  Other Topics Concern  . Not on file  Social History Narrative   Exercise: regular   Social Determinants of Health   Financial Resource Strain:   . Difficulty of Paying Living Expenses: Not on file  Food Insecurity:   .  Worried About Charity fundraiser in the Last Year: Not on file  . Ran Out of Food in the Last Year: Not on file  Transportation Needs:   . Lack of Transportation (Medical): Not on file  . Lack of Transportation (Non-Medical): Not on file  Physical Activity:   . Days of Exercise per Week: Not on file  . Minutes of Exercise per Session: Not on file  Stress:   . Feeling of Stress : Not on file  Social Connections:   . Frequency of Communication with Friends and Family: Not on file  . Frequency of Social Gatherings with Friends and Family: Not on file  . Attends Religious Services: Not on file  . Active Member of Clubs or Organizations: Not on file  . Attends Archivist Meetings: Not on file  . Marital Status: Not on file   Not on File Family History  Problem Relation Age of Onset  . Hyperlipidemia Father   . Cancer Brother        Brain  . Cancer Maternal Grandfather        Prostate  . Cancer Paternal Grandfather        Lung  . Graves' disease Mother   . Cancer Maternal Aunt        bile duct cancer  . Cancer Maternal Uncle        lymphoma   No current outpatient medications on file.  Past medical history, social, surgical and family history all reviewed in electronic medical record.  No pertanent information unless stated regarding to the chief complaint.   Review of Systems:  No headache, visual changes, nausea, vomiting, diarrhea, constipation, dizziness, abdominal pain, skin rash, fevers, chills, night sweats, weight loss, swollen lymph nodes, body aches, joint swelling, muscle aches, chest pain, shortness of breath, mood changes.   Objective  Blood pressure 118/70, pulse 82, height 5\' 6"  (1.676 m), weight 226 lb (102.5 kg), SpO2 98 %.    General: No apparent distress alert and oriented x3 mood and affect normal, dressed appropriately.  HEENT: Pupils equal, extraocular movements intact  Respiratory: Patient's speak in full sentences and does not appear  short of breath  Cardiovascular: No lower extremity edema, non tender, no erythema  Skin: Warm dry intact with no signs of infection or rash on extremities or on axial skeleton.  Abdomen: Soft nontender  Neuro: Cranial nerves II through XII are intact, neurovascularly intact in all extremities with 2+ DTRs and 2+ pulses.  Lymph: No lymphadenopathy of posterior or anterior cervical chain or axillae bilaterally.  Gait normal with good balance and coordination.  MSK:  Non tender with full range of motion and good stability and symmetric strength and tone of shoulders, elbows, wrist, hip, knee and ankles bilaterally.  Back exam does have some loss of lordosis.  Tenderness to palpation in the right sacroiliac joint.  Mild positive .  Negative straight leg test.  Near full range of motion of the back.  Mild weakness of the hip abductors bilaterally  Osteopathic findings  C2 flexed rotated and side bent right C4 flexed rotated and side bent left C6 flexed rotated and side bent left T3 extended rotated and side bent right inhaled third rib T9 extended rotated and side bent left L2 flexed rotated and side bent right Sacrum right on right    Impression and Recommendations:     This case required medical decision making of moderate complexity. The above documentation has been reviewed and is accurate and complete [name], DO       Note: This dictation was prepared with Dragon dictation along with smaller phrase technology. Any transcriptional errors that result from this process are unintentional.

## 2019-04-23 ENCOUNTER — Encounter: Payer: Self-pay | Admitting: Family Medicine

## 2019-04-23 NOTE — Assessment & Plan Note (Signed)
Decision today to treat with OMT was based on Physical Exam  After verbal consent patient was treated with HVLA, ME, FPR techniques in cervical, thoracic, rib, lumbar and sacral areas  Patient tolerated the procedure well with improvement in symptoms  Patient given exercises, stretches and lifestyle modifications  See medications in patient instructions if given  Patient will follow up in 12 weeks 

## 2019-04-23 NOTE — Assessment & Plan Note (Signed)
Patient is doing relatively well with conservative therapy.  Discussed continuing the core strength, continue the exercise, discussed ergonomics, follow-up again in 3 to 4 months

## 2019-05-25 ENCOUNTER — Ambulatory Visit: Payer: BC Managed Care – PPO

## 2019-07-02 ENCOUNTER — Encounter: Payer: Self-pay | Admitting: Internal Medicine

## 2019-08-20 ENCOUNTER — Ambulatory Visit: Payer: BC Managed Care – PPO | Admitting: Family Medicine

## 2019-10-04 IMAGING — DX DG LUMBAR SPINE COMPLETE 4+V
5 series · 5 of 5 positions shown · non-contrast
Comparison: None.

CLINICAL DATA: Chronic low back pain

EXAM:
LUMBAR SPINE - COMPLETE 4+ VIEW

[l-spine ap]
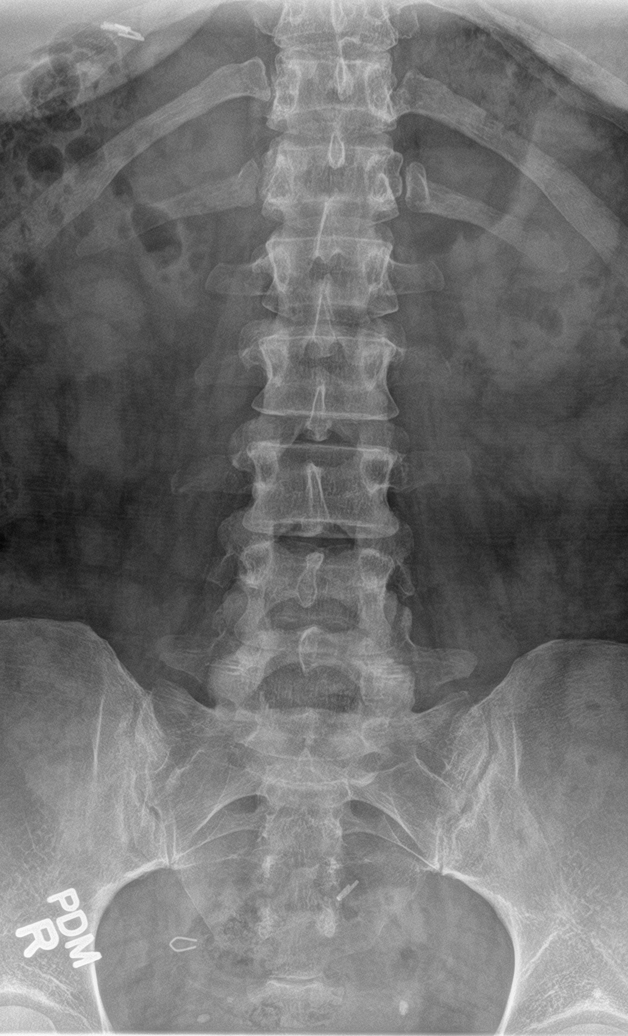

[l-spine obl (1 of 2)]
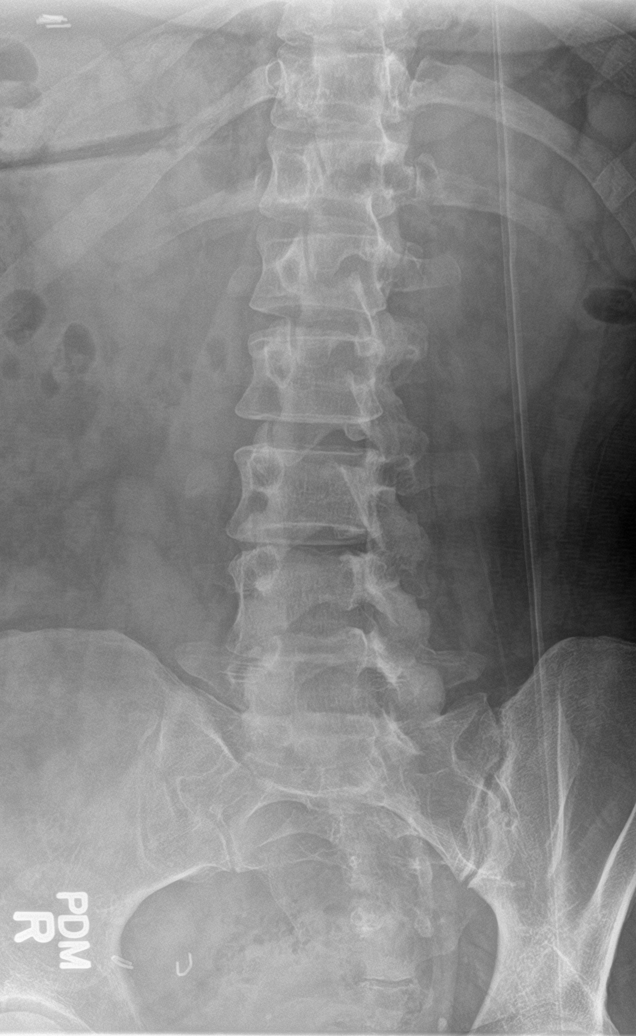

[l-spine obl (2 of 2)]
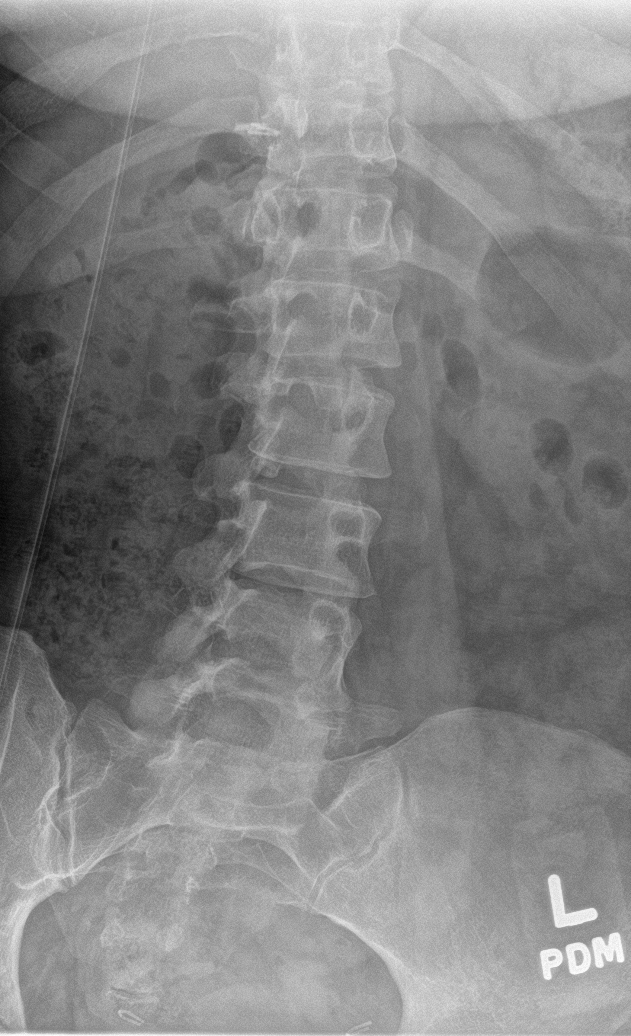

[l-spine lat]
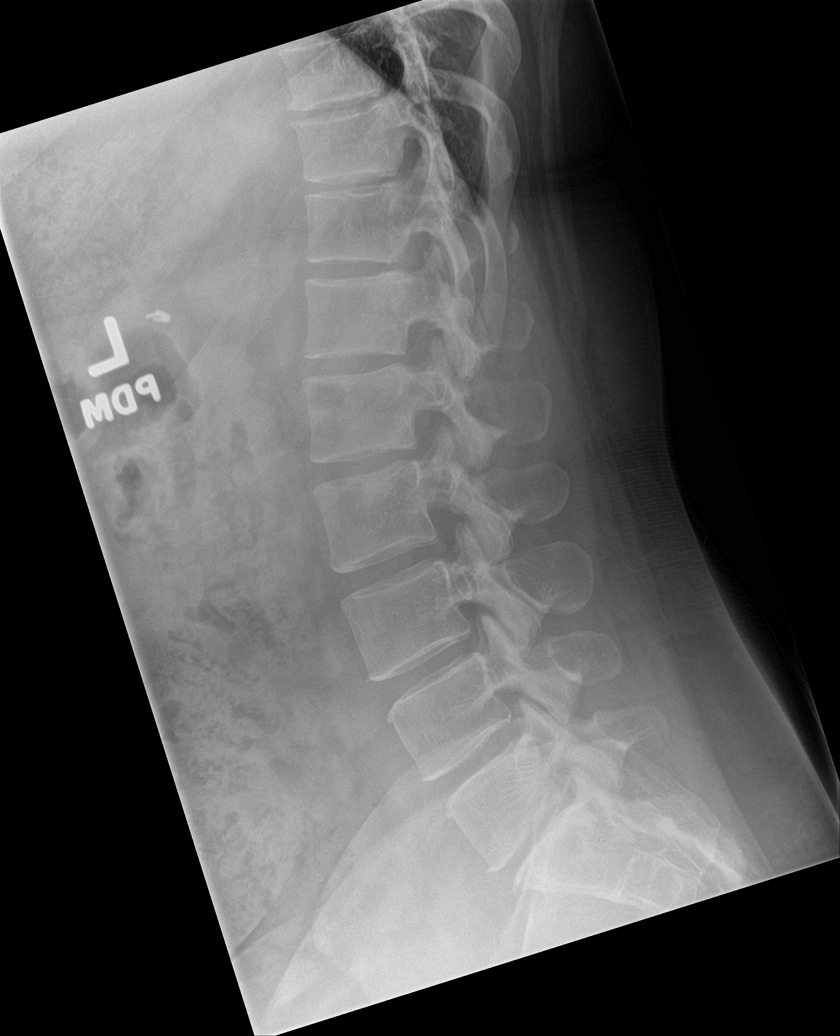

[l-spine spot]
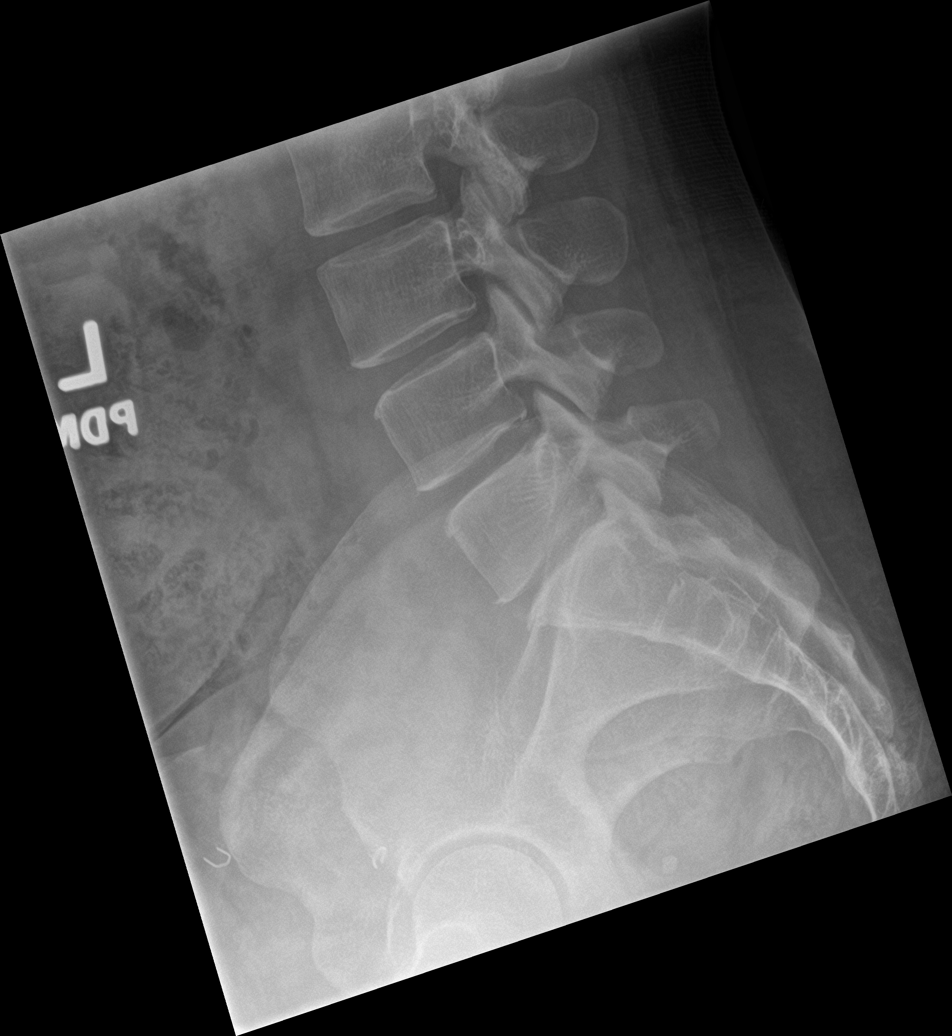

[5 of 5 positions shown; findings below may reference images not displayed]

FINDINGS: Lumbar alignment within normal limits. Mild disc space narrowing at
L4-L5 and L5-S1. Vertebral body heights are maintained.
IMPRESSION: Mild degenerative changes, most marked at L5-S1. No acute osseous
abnormality

## 2021-01-03 ENCOUNTER — Encounter: Payer: Self-pay | Admitting: Internal Medicine

## 2021-01-03 DIAGNOSIS — Z83430 Family history of elevated lipoprotein(a): Secondary | ICD-10-CM

## 2021-01-06 DIAGNOSIS — Z83430 Family history of elevated lipoprotein(a): Secondary | ICD-10-CM | POA: Insufficient documentation

## 2021-01-14 ENCOUNTER — Other Ambulatory Visit: Payer: Self-pay

## 2021-01-14 ENCOUNTER — Other Ambulatory Visit (INDEPENDENT_AMBULATORY_CARE_PROVIDER_SITE_OTHER): Payer: BC Managed Care – PPO

## 2021-01-14 ENCOUNTER — Other Ambulatory Visit: Payer: BC Managed Care – PPO

## 2021-01-14 DIAGNOSIS — Z83430 Family history of elevated lipoprotein(a): Secondary | ICD-10-CM

## 2021-01-14 LAB — LIPID PANEL
Cholesterol: 181 mg/dL (ref 0–200)
HDL: 41 mg/dL (ref >39.00)
LDL Cholesterol: 119 mg/dL — ABNORMAL HIGH (ref 0–99)
NonHDL: 140.25
Total CHOL/HDL Ratio: 4
Triglycerides: 107 mg/dL (ref 0.0–149.0)
VLDL: 21.4 mg/dL (ref 0.0–40.0)

## 2021-01-19 LAB — LIPOPROTEIN A (LPA): Lipoprotein (a): 134 nmol/L — ABNORMAL HIGH (ref <75)

## 2021-02-10 ENCOUNTER — Encounter: Payer: Self-pay | Admitting: Internal Medicine

## 2021-02-10 DIAGNOSIS — Z8249 Family history of ischemic heart disease and other diseases of the circulatory system: Secondary | ICD-10-CM | POA: Insufficient documentation

## 2022-08-16 ENCOUNTER — Telehealth: Payer: Self-pay | Admitting: Internal Medicine

## 2022-08-16 NOTE — Telephone Encounter (Signed)
Patient called and asked to schedule an appointment. She was last seen in 2020 and would like to know if Dr. Lawerance Bach would be willing to take her on as a new patient. Best callback is [phone].

## 2022-08-17 NOTE — Telephone Encounter (Signed)
Ok to schedule.

## 2022-08-25 ENCOUNTER — Encounter: Payer: Self-pay | Admitting: Internal Medicine

## 2022-08-25 NOTE — Progress Notes (Signed)
Subjective:    Patient ID: Theresa Bray, female    DOB: October 06, 1985, 37 y.o.   MRN: [id number]      HPI Theresa Bray is here for a Physical exam and her chronic medical problems.  Here to re-establish - last seen 2020.    Overall doing well.  No major changes since she was here last except for significant weight loss which she has been doing weight exercise and diet changes.  Medications and allergies reviewed with patient and updated if appropriate.  Current Outpatient Medications on File Prior to Visit  Medication Sig Dispense Refill   Ferrous Sulfate (IRON) 325 (65 Fe) MG TABS Take by mouth every other day.     No current facility-administered medications on file prior to visit.    Review of Systems  Constitutional:  Negative for fever.  Eyes:  Negative for visual disturbance.  Respiratory:  Negative for cough, shortness of breath and wheezing.   Cardiovascular:  Negative for chest pain, palpitations and leg swelling.  Gastrointestinal:  Positive for anal bleeding (comes with constipation) and constipation. Negative for abdominal pain, blood in stool and diarrhea.       No gerd  Genitourinary:  Negative for dysuria.  Musculoskeletal:  Negative for arthralgias and back pain.  Skin:  Negative for rash.  Neurological:  Negative for light-headedness and headaches.  Psychiatric/Behavioral:  Negative for dysphoric mood. The patient is not nervous/anxious.        Objective:   Vitals:   08/26/22 1056  BP: 100/68  Pulse: 65  Temp: 98.2 F (36.8 C)  SpO2: 99%   Filed Weights   08/26/22 1056  Weight: 170 lb (77.1 kg)   Body mass index is 27.44 kg/m.  BP Readings from Last 3 Encounters:  08/26/22 100/68  04/22/19 118/70  01/31/19 90/62    Wt Readings from Last 3 Encounters:  08/26/22 170 lb (77.1 kg)  04/22/19 226 lb (102.5 kg)  01/31/19 215 lb 12.8 oz (97.9 kg)       Physical Exam Constitutional: She appears well-developed and well-nourished. No  distress.  HENT:  Head: Normocephalic and atraumatic.  Right Ear: External ear normal. Normal ear canal and TM Left Ear: External ear normal.  Normal ear canal and TM Mouth/Throat: Oropharynx is clear and moist.  Eyes: Conjunctivae normal.  Neck: Neck supple. No tracheal deviation present. No thyromegaly present.  No carotid bruit  Cardiovascular: Normal rate, regular rhythm and normal heart sounds.   No murmur heard.  No edema. Pulmonary/Chest: Effort normal and breath sounds normal. No respiratory distress. She has no wheezes. She has no rales.  Breast: deferred   Abdominal: Soft. She exhibits no distension. There is no tenderness.  Lymphadenopathy: She has no cervical adenopathy.  Skin: Skin is warm and dry. She is not diaphoretic.  Psychiatric: She has a normal mood and affect. Her behavior is normal.     Lab Results  Component Value Date   WBC 6.7 11/06/2018   HGB 14.5 11/06/2018   HCT 43.0 11/06/2018   PLT 275.0 11/06/2018   GLUCOSE 86 11/06/2018   CHOL 181 01/14/2021   TRIG 107.0 01/14/2021   HDL 41.00 01/14/2021   LDLCALC 119 (H) 01/14/2021   ALT 22 11/06/2018   AST 20 11/06/2018   NA 138 11/06/2018   K 3.9 11/06/2018   CL 105 11/06/2018   CREATININE 0.61 11/06/2018   BUN 10 11/06/2018   CO2 25 11/06/2018   TSH 2.16 11/06/2018  Assessment & Plan:   Physical exam: Screening blood work  ordered Exercise  regular Weight  has lost about 70 lbs in past 9  months-with regular exercise, diet changes and decreased portions Substance abuse  none   Reviewed recommended immunizations.   Health Maintenance  Topic Date Due   PAP SMEAR-Modifier  Never done   COVID-19 Vaccine (6 - 2023-24 season) 09/10/2022 (Originally 02/26/2022)   INFLUENZA VACCINE  11/10/2022   DTaP/Tdap/Td (2 - Td or Tdap) 11/30/2025   HIV Screening  Completed   HPV VACCINES  Aged Out   Hepatitis C Screening  Discontinued          See Problem List for Assessment and Plan of  chronic medical problems.

## 2022-08-25 NOTE — Patient Instructions (Addendum)
Blood work was ordered.   The lab is on the first floor.    Medications changes include :   none      Return in about 1 year (around 08/26/2023) for Physical Exam.   Health Maintenance, Female Adopting a healthy lifestyle and getting preventive care are important in promoting health and wellness. Ask your health care provider about: The right schedule for you to have regular tests and exams. Things you can do on your own to prevent diseases and keep yourself healthy. What should I know about diet, weight, and exercise? Eat a healthy diet  Eat a diet that includes plenty of vegetables, fruits, low-fat dairy products, and lean protein. Do not eat a lot of foods that are high in solid fats, added sugars, or sodium. Maintain a healthy weight Body mass index (BMI) is used to identify weight problems. It estimates body fat based on height and weight. Your health care provider can help determine your BMI and help you achieve or maintain a healthy weight. Get regular exercise Get regular exercise. This is one of the most important things you can do for your health. Most adults should: Exercise for at least 150 minutes each week. The exercise should increase your heart rate and make you sweat (moderate-intensity exercise). Do strengthening exercises at least twice a week. This is in addition to the moderate-intensity exercise. Spend less time sitting. Even light physical activity can be beneficial. Watch cholesterol and blood lipids Have your blood tested for lipids and cholesterol at 37 years of age, then have this test every 5 years. Have your cholesterol levels checked more often if: Your lipid or cholesterol levels are high. You are older than 37 years of age. You are at high risk for heart disease. What should I know about cancer screening? Depending on your health history and family history, you may need to have cancer screening at various ages. This may include screening  for: Breast cancer. Cervical cancer. Colorectal cancer. Skin cancer. Lung cancer. What should I know about heart disease, diabetes, and high blood pressure? Blood pressure and heart disease High blood pressure causes heart disease and increases the risk of stroke. This is more likely to develop in people who have high blood pressure readings or are overweight. Have your blood pressure checked: Every 3-5 years if you are 94-20 years of age. Every year if you are 20 years old or older. Diabetes Have regular diabetes screenings. This checks your fasting blood sugar level. Have the screening done: Once every three years after age 53 if you are at a normal weight and have a low risk for diabetes. More often and at a younger age if you are overweight or have a high risk for diabetes. What should I know about preventing infection? Hepatitis B If you have a higher risk for hepatitis B, you should be screened for this virus. Talk with your health care provider to find out if you are at risk for hepatitis B infection. Hepatitis C Testing is recommended for: Everyone born from [birth year] through 1965. Anyone with known risk factors for hepatitis C. Sexually transmitted infections (STIs) Get screened for STIs, including gonorrhea and chlamydia, if: You are sexually active and are younger than 37 years of age. You are older than 37 years of age and your health care provider tells you that you are at risk for this type of infection. Your sexual activity has changed since you were last screened, and you are  at increased risk for chlamydia or gonorrhea. Ask your health care provider if you are at risk. Ask your health care provider about whether you are at high risk for HIV. Your health care provider may recommend a prescription medicine to help prevent HIV infection. If you choose to take medicine to prevent HIV, you should first get tested for HIV. You should then be tested every 3 months for as long as you  are taking the medicine. Pregnancy If you are about to stop having your period (premenopausal) and you may become pregnant, seek counseling before you get pregnant. Take 400 to 800 micrograms (mcg) of folic acid every day if you become pregnant. Ask for birth control (contraception) if you want to prevent pregnancy. Osteoporosis and menopause Osteoporosis is a disease in which the bones lose minerals and strength with aging. This can result in bone fractures. If you are 67 years old or older, or if you are at risk for osteoporosis and fractures, ask your health care provider if you should: Be screened for bone loss. Take a calcium or vitamin D supplement to lower your risk of fractures. Be given hormone replacement therapy (HRT) to treat symptoms of menopause. Follow these instructions at home: Alcohol use Do not drink alcohol if: Your health care provider tells you not to drink. You are pregnant, may be pregnant, or are planning to become pregnant. If you drink alcohol: Limit how much you have to: 0-1 drink a day. Know how much alcohol is in your drink. In the [place] one drink equals one 12 oz bottle of beer (355 mL), one 5 oz glass of wine (148 mL), or one 1 oz glass of hard liquor (44 mL). Lifestyle Do not use any products that contain nicotine or tobacco. These products include cigarettes, chewing tobacco, and vaping devices, such as e-cigarettes. If you need help quitting, ask your health care provider. Do not use street drugs. Do not share needles. Ask your health care provider for help if you need support or information about quitting drugs. General instructions Schedule regular health, dental, and eye exams. Stay current with your vaccines. Tell your health care provider if: You often feel depressed. You have ever been abused or do not feel safe at home. Summary Adopting a healthy lifestyle and getting preventive care are important in promoting health and wellness. Follow your  health care provider's instructions about healthy diet, exercising, and getting tested or screened for diseases. Follow your health care provider's instructions on monitoring your cholesterol and blood pressure. This information is not intended to replace advice given to you by your health care provider. Make sure you discuss any questions you have with your health care provider. Document Revised: 08/17/2020 Document Reviewed: 08/17/2020 Elsevier Patient Education  2023 [employer].

## 2022-08-26 ENCOUNTER — Encounter: Payer: Self-pay | Admitting: Internal Medicine

## 2022-08-26 ENCOUNTER — Ambulatory Visit: Payer: BC Managed Care – PPO | Admitting: Internal Medicine

## 2022-08-26 VITALS — BP 100/68 | HR 65 | Temp 98.2°F | Ht 66.0 in | Wt 170.0 lb

## 2022-08-26 DIAGNOSIS — Z0000 Encounter for general adult medical examination without abnormal findings: Secondary | ICD-10-CM

## 2022-08-26 DIAGNOSIS — L309 Dermatitis, unspecified: Secondary | ICD-10-CM | POA: Diagnosis not present

## 2022-08-26 DIAGNOSIS — E7841 Elevated Lipoprotein(a): Secondary | ICD-10-CM | POA: Insufficient documentation

## 2022-08-26 LAB — COMPREHENSIVE METABOLIC PANEL
ALT: 11 U/L (ref 0–35)
AST: 15 U/L (ref 0–37)
Albumin: 4.1 g/dL (ref 3.5–5.2)
Alkaline Phosphatase: 41 U/L (ref 39–117)
BUN: 16 mg/dL (ref 6–23)
CO2: 27 mEq/L (ref 19–32)
Calcium: 9.3 mg/dL (ref 8.4–10.5)
Chloride: 105 mEq/L (ref 96–112)
Creatinine, Ser: 0.74 mg/dL (ref 0.40–1.20)
GFR: 103.74 mL/min (ref >60.00)
Glucose, Bld: 80 mg/dL (ref 70–99)
Potassium: 4.3 mEq/L (ref 3.5–5.1)
Sodium: 138 mEq/L (ref 135–145)
Total Bilirubin: 0.3 mg/dL (ref 0.2–1.2)
Total Protein: 7 g/dL (ref 6.0–8.3)

## 2022-08-26 LAB — CBC WITH DIFFERENTIAL/PLATELET
Basophils Absolute: 0 10*3/uL (ref 0.0–0.1)
Basophils Relative: 0.4 % (ref 0.0–3.0)
Eosinophils Absolute: 0.1 10*3/uL (ref 0.0–0.7)
Eosinophils Relative: 1.4 % (ref 0.0–5.0)
HCT: 37.7 % (ref 36.0–46.0)
Hemoglobin: 12.5 g/dL (ref 12.0–15.0)
Lymphocytes Relative: 26.5 % (ref 12.0–46.0)
Lymphs Abs: 1.5 10*3/uL (ref 0.7–4.0)
MCHC: 33 g/dL (ref 30.0–36.0)
MCV: 77.8 fl — ABNORMAL LOW (ref 78.0–100.0)
Monocytes Absolute: 0.4 10*3/uL (ref 0.1–1.0)
Monocytes Relative: 7.6 % (ref 3.0–12.0)
Neutro Abs: 3.6 10*3/uL (ref 1.4–7.7)
Neutrophils Relative %: 64.1 % (ref 43.0–77.0)
Platelets: 256 10*3/uL (ref 150.0–400.0)
RBC: 4.85 Mil/uL (ref 3.87–5.11)
RDW: 14.7 % (ref 11.5–15.5)
WBC: 5.6 10*3/uL (ref 4.0–10.5)

## 2022-08-26 LAB — LIPID PANEL
Cholesterol: 178 mg/dL (ref 0–200)
HDL: 48 mg/dL (ref >39.00)
LDL Cholesterol: 117 mg/dL — ABNORMAL HIGH (ref 0–99)
NonHDL: 129.66
Total CHOL/HDL Ratio: 4
Triglycerides: 64 mg/dL (ref 0.0–149.0)
VLDL: 12.8 mg/dL (ref 0.0–40.0)

## 2022-08-26 LAB — TSH: TSH: 1.77 u[IU]/mL (ref 0.35–5.50)

## 2022-08-26 NOTE — Assessment & Plan Note (Signed)
Chronic Has bilateral hand eczema with significant dryness no matter how much she lotions She would like to get established with a dermatologist-also has some other skin issues Referral ordered for COVID her mobility

## 2022-11-30 ENCOUNTER — Encounter: Payer: Self-pay | Admitting: Dermatology

## 2022-11-30 ENCOUNTER — Ambulatory Visit (INDEPENDENT_AMBULATORY_CARE_PROVIDER_SITE_OTHER): Payer: BC Managed Care – PPO | Admitting: Dermatology

## 2022-11-30 VITALS — BP 100/65

## 2022-11-30 DIAGNOSIS — L309 Dermatitis, unspecified: Secondary | ICD-10-CM

## 2022-11-30 MED ORDER — CLOBETASOL PROPIONATE 0.05 % EX OINT: 1.0000 | TOPICAL_OINTMENT | Freq: Two times a day (BID) | CUTANEOUS | 0 refills | Status: DC

## 2022-11-30 MED ORDER — TACROLIMUS 0.1 % EX OINT: TOPICAL_OINTMENT | Freq: Two times a day (BID) | CUTANEOUS | 0 refills | Status: AC

## 2022-11-30 NOTE — Progress Notes (Signed)
   New Patient Visit   Subjective  Theresa Bray is a 37 y.o. female who presents for the following: Rash on the right hand x 6 months. It itches at night. It is a 7 out of 10 for itch. She has tried [employer] healing cream. Hydrocortisone 2.5% cream periodically for 2 months. She has a HX of Eczema.   The following portions of the chart were reviewed this encounter and updated as appropriate: medications, allergies, medical history  Review of Systems:  No other skin or systemic complaints except as noted in HPI or Assessment and Plan.  Objective  Well appearing patient in no apparent distress; mood and affect are within normal limits.       A focused examination was performed of the following areas: hands  Relevant exam findings are noted in the Assessment and Plan.    Assessment & Plan     Hand Eczema Exam:   mild hypopigmentation and mild erythema at dorsal and palmar right hand mostly 3rd, 4th, and 5th digits  Flared  Treatment Plan: Clobetasol ointment BID x 14 days then Tacrolimus ointment BID  Hand Dermatitis is a chronic type of eczema that can come and go on the hands and fingers.  While there is no cure, the rash and symptoms can be managed with topical prescription medications, and for more severe cases, with systemic medications.  Recommend mild soap and routine use of moisturizing cream after handwashing.  Minimize soap/water exposure when possible.    Return in about 8 months (around 07/31/2023) for hand eczema.  Jaclynn Guarneri, CMA, am acting as scribe for [employer], DO.   Documentation: I have reviewed the above documentation for accuracy and completeness, and I agree with the above.  Langston Reusing, DO

## 2022-11-30 NOTE — Patient Instructions (Addendum)
Hi Theresa Bray,  Thank you for visiting my office today. 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 Below is a summary of the essential instructions from our discussion:  - Medications Prescribed for Hand Eczema:   - Clobetasol Ointment: Apply a thin layer twice daily for two weeks. If significant improvement is observed within one week, you may conclude the treatment early. However, to avoid potential side effects, do not use beyond two weeks.   - Tacrolimus Ointment: This non-steroidal anti-inflammatory cream serves as a secondary treatment option, helping to prevent issues like hypopigmentation or skin thinning.  - Skin Care and Lifestyle Adjustments:   - Gloves: Essential for protecting your skin while washing dishes or using cleaning agents.   - Moisturizers: Continue with Jergens and Gold Bond. For handwashing, consider gentler soap options such as Dove, Cetaphil, or Eucerin. La Roche-Posay and Eucerin Advanced Repair are also recommended.   - Water Temperature: Opt for warm water when washing hands to avoid further irritation.  - Follow-Up:   - We will check in approximately eight months from now to ensure your treatment remains effective and your prescriptions are current. Should you have any immediate concerns or encounter issues with medication coverage, please contact me via MyChart.  I appreciate the opportunity to assist you in your journey towards healthier skin. Should you have any questions or require further support, do not hesitate to reach out.  Best regards,  Dr. Langston Reusing,  Dermatologist     Important Information  Due to recent changes in healthcare laws, you may see results of your pathology and/or laboratory studies on MyChart before the doctors have had a chance to review them. We understand that in some cases there may be results that are confusing or concerning to you. Please understand  that not all results are received at the same time and often the doctors may need to interpret multiple results in order to provide you with the best plan of care or course of treatment. Therefore, we ask that you please give [place] 2 business days to thoroughly review all your results before contacting the office for clarification. Should we see a critical lab result, you will be contacted sooner.   If You Need Anything After Your Visit  If you have any questions or concerns for your doctor, please call our main line at [phone] If no one answers, please leave a voicemail as directed and we will return your call as soon as possible. Messages left after 4 pm will be answered the following business day.   You may also send [place] a message via MyChart. We typically respond to MyChart messages within 1-2 business days.  For prescription refills, please ask your pharmacy to contact our office. Our fax number is [phone].  If you have an urgent issue when the clinic is closed that cannot wait until the next business day, you can page your doctor at the number below.    Please note that while we do our best to be available for urgent issues outside of office hours, we are not available 24/7.   If you have an urgent issue and are unable to reach [place], you may choose to seek medical care at your doctor's office, retail clinic, urgent care center, or emergency room.  If you have a medical emergency, please immediately call 911 or go to the emergency department. In the event of inclement weather, please call our main line  at [phone] for an update on the status of any delays or closures.  Dermatology Medication Tips: Please keep the boxes that topical medications come in in order to help keep track of the instructions about where and how to use these. Pharmacies typically print the medication instructions only on the boxes and not directly on the medication tubes.   If your medication is too  expensive, please contact our office at [phone] or send [place] a message through MyChart.   We are unable to tell what your co-pay for medications will be in advance as this is different depending on your insurance coverage. However, we may be able to find a substitute medication at lower cost or fill out paperwork to get insurance to cover a needed medication.   If a prior authorization is required to get your medication covered by your insurance company, please allow [place] 1-2 business days to complete this process.  Drug prices often vary depending on where the prescription is filled and some pharmacies may offer cheaper prices.  The website www.goodrx.com contains coupons for medications through different pharmacies. The prices here do not account for what the cost may be with help from insurance (it may be cheaper with your insurance), but the website can give you the price if you did not use any insurance.  - You can print the associated coupon and take it with your prescription to the pharmacy.  - You may also stop by our office during regular business hours and pick up a GoodRx coupon card.  - If you need your prescription sent electronically to a different pharmacy, notify our office through [facility] or by phone at [phone]

## 2023-04-17 ENCOUNTER — Other Ambulatory Visit: Payer: Self-pay | Admitting: Medical Genetics

## 2023-05-15 ENCOUNTER — Other Ambulatory Visit (HOSPITAL_COMMUNITY)
Admission: RE | Admit: 2023-05-15 | Discharge: 2023-05-15 | Disposition: A | Discharge status: Home / Self Care | Payer: Self-pay | Source: Ambulatory Visit | Attending: Oncology | Admitting: Oncology

## 2023-05-25 LAB — GENECONNECT MOLECULAR SCREEN: Genetic Analysis Overall Interpretation: NEGATIVE

## 2023-08-03 ENCOUNTER — Ambulatory Visit: Payer: BC Managed Care – PPO | Admitting: Dermatology

## 2023-08-03 ENCOUNTER — Encounter: Payer: Self-pay | Admitting: Dermatology

## 2023-08-03 VITALS — BP 104/69

## 2023-08-03 DIAGNOSIS — L309 Dermatitis, unspecified: Secondary | ICD-10-CM

## 2023-08-03 MED ORDER — CLOBETASOL PROPIONATE 0.05 % EX CREA
TOPICAL_CREAM | CUTANEOUS | 0 refills | Status: AC
Start: 2023-08-03 — End: ?

## 2023-08-03 NOTE — Patient Instructions (Addendum)
 Hello Theresa Bray,  Thank you for visiting today. Here is a summary of the key instructions:  - Medications:   - Use clobetasol  cream twice a day for up to 2 weeks during flare-ups   - Use tacrolimus  after for a few weeks to prevent rebound flares from occurring too quickly   - Apply Gold Bond hand moisturizer at night, followed by a layer of Vaseline  - Lifestyle Changes:   - Wear gloves when cleaning and washing dishes   - Avoid cutting spicy foods like peppers, garlic, and onions without gloves  - Follow-up:   - Annual check-up appointment   - Contact the office sooner if any suspicious skin changes occur  - Prescriptions:   - New prescription for clobetasol  cream with 4 refills   - Refills for current medications for one year  Please reach out if you have any questions or concerns.  Warm regards,  Dr. Louana Roup Dermatology  Important Information  Due to recent changes in healthcare laws, you may see results of your pathology and/or laboratory studies on MyChart before the doctors have had a chance to review them. We understand that in some cases there may be results that are confusing or concerning to you. Please understand that not all results are received at the same time and often the doctors may need to interpret multiple results in order to provide you with the best plan of care or course of treatment. Therefore, we ask that you please give us  2 business days to thoroughly review all your results before contacting the office for clarification. Should we see a critical lab result, you will be contacted sooner.   If You Need Anything After Your Visit  If you have any questions or concerns for your doctor, please call our main line at [phone] If no one answers, please leave a voicemail as directed and we will return your call as soon as possible. Messages left after 4 pm will be answered the following business day.   You may also send us  a message via MyChart. We  typically respond to MyChart messages within 1-2 business days.  For prescription refills, please ask your pharmacy to contact our office. Our fax number is [phone].  If you have an urgent issue when the clinic is closed that cannot wait until the next business day, you can page your doctor at the number below.    Please note that while we do our best to be available for urgent issues outside of office hours, we are not available 24/7.   If you have an urgent issue and are unable to reach us , you may choose to seek medical care at your doctor's office, retail clinic, urgent care center, or emergency room.  If you have a medical emergency, please immediately call 911 or go to the emergency department. In the event of inclement weather, please call our main line at [phone] for an update on the status of any delays or closures.  Dermatology Medication Tips: Please keep the boxes that topical medications come in in order to help keep track of the instructions about where and how to use these. Pharmacies typically print the medication instructions only on the boxes and not directly on the medication tubes.   If your medication is too expensive, please contact our office at [phone] or send us  a message through MyChart.   We are unable to tell what your co-pay for medications will be in advance as this is different depending on  your insurance coverage. However, we may be able to find a substitute medication at lower cost or fill out paperwork to get insurance to cover a needed medication.   If a prior authorization is required to get your medication covered by your insurance company, please allow us  1-2 business days to complete this process.  Drug prices often vary depending on where the prescription is filled and some pharmacies may offer cheaper prices.  The website www.goodrx.com contains coupons for medications through different pharmacies. The prices here do not account for what  the cost may be with help from insurance (it may be cheaper with your insurance), but the website can give you the price if you did not use any insurance.  - You can print the associated coupon and take it with your prescription to the pharmacy.  - You may also stop by our office during regular business hours and pick up a GoodRx coupon card.  - If you need your prescription sent electronically to a different pharmacy, notify our office through [facility] or by phone at [phone]

## 2023-08-03 NOTE — Progress Notes (Signed)
   Follow-Up Visit   Subjective  Theresa Bray is a 38 y.o. female who presents for the following: Hand Eczema  Patient present today for follow up visit. Patient was last evaluated on 11/30/22. At this visit patient was prescribed Clobetasol  oint & tacrolimus  oint to alternate use every 2 weeks. Patient reports sxs are better. She stated that during the winter she flared a few times but returning to alternating cream use it resolved. Patient denies medication changes.  The following portions of the chart were reviewed this encounter and updated as appropriate: medications, allergies, medical history  Review of Systems:  No other skin or systemic complaints except as noted in HPI or Assessment and Plan.  Objective  Well appearing patient in no apparent distress; mood and affect are within normal limits.   A focused examination was performed of the following areas: hands   Relevant exam findings are noted in the Assessment and Plan.    Assessment & Plan   HAND DERMATITIS Exam Scaly pink plaques +/- fissures  Controlled  - Assessment: Patient has a history of hand eczema with intermittent flares. Triggers include weather changes, particularly winter, and exposure to irritants such as dish soap and spicy foods (peppers, garlic, onions). She has been managing her condition with topical treatments, including clobetasol  and tacrolimus , which have been effective in controlling flares. The patient reports using clobetasol  for brief periods (1-2 days) with rapid improvement. She alternates with tacrolimus  but does not use it for extended periods. The patient's skin currently appears to be in good condition.  Hand Dermatitis is a chronic type of eczema that can come and go on the hands and fingers.  While there is no cure, the rash and symptoms can be managed with topical prescription medications, and for more severe cases, with systemic medications.  Recommend mild soap and routine use of  moisturizing cream after handwashing.  Minimize soap/water exposure when possible.    Treatment Plan - Changed from Clobetasol  oint to cream.  - reminded pt to wear gloves while cleaning and/or washing dishes. - Encouraged lotion use after hand washing to lock in moisture.     Advise application of Vaseline over moisturizer at night  Discussed option of Dupixent for long-term management; patient declined at this time  Recommend mild soap and moisturizing cream with hand washing.   HAND ECZEMA   Related Medications clobetasol  cream (TEMOVATE ) 0.05 % Apply to affected area twice daily for 2 weeks then stop.  Return in about 1 year (around 08/02/2024) for hand dermatitis.    Documentation: I have reviewed the above documentation for accuracy and completeness, and I agree with the above.  I, Shirron Louanne Roussel, CMA, am acting as scribe for [employer], DO.   Louana Roup, DO

## 2024-08-05 ENCOUNTER — Ambulatory Visit: Admitting: Dermatology
